# Patient Record
Sex: Male | Born: 1995 | Race: White | Hispanic: No | Marital: Single | Smoking: Current every day smoker
Health system: Southern US, Community
[De-identification: ages and names within clinical notes are randomized; demographics above are authoritative.]

## PROBLEM LIST (undated history)

## (undated) DIAGNOSIS — G8929 Other chronic pain: Secondary | ICD-10-CM

## (undated) DIAGNOSIS — M549 Dorsalgia, unspecified: Secondary | ICD-10-CM

## (undated) HISTORY — PX: HERNIA REPAIR: SHX51

---
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
## 2015-01-12 ENCOUNTER — Encounter (HOSPITAL_COMMUNITY): Payer: Self-pay

## 2015-01-12 ENCOUNTER — Emergency Department (HOSPITAL_COMMUNITY)
Admission: EM | Admit: 2015-01-12 | Discharge: 2015-01-13 | Disposition: A | Discharge status: Home / Self Care | Payer: Medicaid Other | Attending: Emergency Medicine | Admitting: Emergency Medicine

## 2015-01-12 DIAGNOSIS — G8929 Other chronic pain: Secondary | ICD-10-CM | POA: Insufficient documentation

## 2015-01-12 DIAGNOSIS — Z720 Tobacco use: Secondary | ICD-10-CM | POA: Insufficient documentation

## 2015-01-12 DIAGNOSIS — F1410 Cocaine abuse, uncomplicated: Secondary | ICD-10-CM | POA: Diagnosis not present

## 2015-01-12 NOTE — ED Notes (Addendum)
Pt presents with c/o request for detox from cocaine. Pt reports his last use was a few hours ago.Pt reporting that "his heart hurts because he used a lot of cocaine". Pt denies SI/HI, calm and cooperative.

## 2015-01-13 NOTE — ED Provider Notes (Signed)
CSN: [id number]     Arrival date & time 01/12/15  2122 History   First MD Initiated Contact with Patient 01/13/15 0121     Chief Complaint  Patient presents with  . Detox      (Consider location/radiation/quality/duration/timing/severity/associated sxs/prior Treatment) HPI Comments: Patient brought in by family for cocaine abuse.  Patient states he does not need help with his cocaine abuse.  He does not feel he is addicted, is not suicidal,  he is not homicidal  The history is provided by the patient.    Past Medical History  Diagnosis Date  . Chronic back pain    Past Surgical History  Procedure Laterality Date  . Hernia repair     No family history on file. History  Substance Use Topics  . Smoking status: Current Every Day Smoker    Types: Cigarettes  . Smokeless tobacco: Never Used  . Alcohol Use: Yes     Comment: occasionally    Review of Systems  Constitutional: Negative for fever.  Gastrointestinal: Negative for nausea.  Skin: Negative for rash.  Neurological: Negative for dizziness.  Psychiatric/Behavioral: Negative for suicidal ideas.  All other systems reviewed and are negative.     Allergies  Review of patient's allergies indicates no known allergies.  Home Medications   Prior to Admission medications   Medication Sig Start Date End Date Taking? Authorizing Provider  albuterol (PROVENTIL HFA;VENTOLIN HFA) 108 (90 BASE) MCG/ACT inhaler Inhale 4 puffs into the lungs every 4 (four) hours as needed for wheezing or shortness of breath (cough). 12/14/14   Loleta Roseory Forbach, MD  benzonatate (TESSALON PERLES) 100 MG capsule Take 1 capsule (100 mg total) by mouth every 6 (six) hours as needed for cough. 12/14/14 12/14/15  Loleta Roseory Forbach, MD   BP 125/72 mmHg  Pulse 76  Temp(Src) 97.5 F (36.4 C) (Oral)  Resp 18  SpO2 100% Physical Exam  Constitutional: He is oriented to person, place, and time. He appears well-nourished.  HENT:  Head: Normocephalic.  Neurological: He  is alert and oriented to person, place, and time.  Psychiatric: He has a normal mood and affect. His speech is normal and behavior is normal. Cognition and memory are normal. He expresses inappropriate judgment. He expresses no suicidal ideation. He expresses no suicidal plans.  Nursing note and vitals reviewed.   ED Course  Procedures (including critical care time) Labs Review Labs Reviewed - No data to display  Imaging Review No results found.   EKG Interpretation None     Patient states he does not need detox from cocaine.  He does not want to stop.  He denies being suicidal and homicidal again.  He has been given outpatient resources MDM   Final diagnoses:  Cocaine abuse         Earley FavorGail Kirsi Hugh, NP 01/13/15 0132  Paula LibraJohn Molpus, MD 01/13/15 [phone]

## 2015-01-13 NOTE — Discharge Instructions (Signed)
Stimulant Use Disorder-Cocaine  Cocaine is one of a group of powerful drugs called stimulants. Cocaine has medical uses for stopping nosebleeds and for pain control before minor nose or dental surgery. However, cocaine is misused because of the effects that it produces. These effects include:   · A feeling of extreme pleasure.  · Alertness.  · High energy.  Common street names for cocaine include coke, crack, blow, snow, and nose candy. Cocaine is snorted, dissolved in water and injected, or smoked.   Stimulants are addictive because they activate regions of the brain that produce both the pleasurable sensation of "reward" and psychological dependence. Together, these actions account for loss of control and the rapid development of drug dependence. This means you become ill without the drug (withdrawal) and need to keep using it to function.   Stimulant use disorder is use of stimulants that disrupts your daily life. It disrupts relationships with family and friends and how you do your job. Cocaine increases your blood pressure and heart rate. It can cause a heart attack or stroke. Cocaine can also cause death from irregular heart rate or seizures.  SYMPTOMS  Symptoms of stimulant use disorder with cocaine include:  · Use of cocaine in larger amounts or over a longer period of time than intended.  · Unsuccessful attempts to cut down or control cocaine use.  · A lot of time spent obtaining, using, or recovering from the effects of cocaine.  · A strong desire or urge to use cocaine (craving).  · Continued use of cocaine in spite of major problems at work, school, or home because of use.  · Continued use of cocaine in spite of relationship problems because of use.  · Giving up or cutting down on important life activities because of cocaine use.  · Use of cocaine over and over in situations when it is physically hazardous, such as driving a car.  · Continued use of cocaine in spite of a physical problem that is likely  related to use. Physical problems can include:  ¨ Malnutrition.  ¨ Nosebleeds.  ¨ Chest pain.  ¨ High blood pressure.  ¨ A hole that develops between the part of your nose that separates your nostrils (perforated nasal septum).  ¨ Lung and kidney damage.  · Continued use of cocaine in spite of a mental problem that is likely related to use. Mental problems can include:  ¨ Schizophrenia-like symptoms.  ¨ Depression.  ¨ Bipolar mood swings.  ¨ Anxiety.  ¨ Sleep problems.  · Need to use more and more cocaine to get the same effect, or lessened effect over time with use of the same amount of cocaine (tolerance).  · Having withdrawal symptoms when cocaine use is stopped, or using cocaine to reduce or avoid withdrawal symptoms. Withdrawal symptoms include:  ¨ Depressed or irritable mood.  ¨ Low energy or restlessness.  ¨ Bad dreams.  ¨ Poor or excessive sleep.  ¨ Increased appetite.  DIAGNOSIS  Stimulant use disorder is diagnosed by your health care provider. You may be asked questions about your cocaine use and how it affects your life. A physical exam may be done. A drug screen may be ordered. You may be referred to a mental health professional. The diagnosis of stimulant use disorder requires at least two symptoms within 12 months. The type of stimulant use disorder depends on the number of signs and symptoms you have. The type may be:  · Mild. Two or three signs and symptoms.  ·   Counseling or talk therapy. Talk therapy addresses the reasons you use cocaine and ways to keep you from using again. Goals of talk therapy include:  Identifying and avoiding triggers for use.  Handling cravings.  Replacing use with healthy activities.  Support groups.  Support groups provide emotional support, advice, and guidance.  Medicine. Certain medicines may decrease cocaine cravings or withdrawal symptoms. HOME CARE INSTRUCTIONS  Take medicines only as directed by your health care provider.  Identify the people and activities that trigger your cocaine use and avoid them.  Keep all follow-up visits as directed by your health care provider. SEEK MEDICAL CARE IF:  Your symptoms get worse or you relapse.  You are not able to take medicines as directed. SEEK IMMEDIATE MEDICAL CARE IF:  You have serious thoughts about hurting yourself or others.  You have a seizure, chest pain, sudden weakness, or loss of speech or vision. FOR MORE INFORMATION  National Institute on Drug Abuse: http://www.price-smith.com/  Substance Abuse and Mental Health Services Administration: SkateOasis.com.pt Document Released: 07/22/2000 Document Revised: 12/09/2013 Document Reviewed: 08/07/2013 [facility] Patient Information 2015 [place], [place]. This information is not intended to replace advice given to you by your health care provider. Make sure you discuss any questions you have with your health care provider.  Emergency Department Resource Guide 1) Find a Doctor and Pay Out of Pocket Although you won't have to find out who is covered by your insurance plan, it is a good idea to ask around and get recommendations. You will then need to call the office and see if the doctor you have chosen will accept you as a new patient and what types of options they offer for patients who are self-pay. Some doctors offer discounts or will set up payment plans for their patients who do not have insurance, but you will need to ask so you aren't surprised when you get to your appointment.  2) Contact Your Local Health Department Not all health departments have doctors that can see patients for sick visits, but many do, so it is worth a call to see if yours does. If you don't know where your local  health department is, you can check in your phone book. The CDC also has a tool to help you locate your state's health department, and many state websites also have listings of all of their local health departments.  3) Find a Walk-in Clinic If your illness is not likely to be very severe or complicated, you may want to try a walk in clinic. These are popping up all over the country in pharmacies, drugstores, and shopping centers. They're usually staffed by nurse practitioners or physician assistants that have been trained to treat common illnesses and complaints. They're usually fairly quick and inexpensive. However, if you have serious medical issues or chronic medical problems, these are probably not your best option.  No Primary Care Doctor: - Call Health Connect at  [phone] - they can help you locate a primary care doctor that  accepts your insurance, provides certain services, etc. - Physician Referral Service- [phone]  Chronic Pain Problems: Organization         Address  Phone   Notes  [facility]  [phone] Patients need to be referred by their primary care doctor.   Medication Assistance: Organization         Address  Phone   Notes  [facility] Medication Assistance Program [address] [place] [place], [place] [address] [phone]  [phone] --Must be a resident of [place] -- Must have NO insurance coverage whatsoever (no Medicaid/ Medicare, etc.) -- The pt. MUST have a primary care doctor that directs their care regularly and follows them in the community   Med[facility]ist  [phone]   [employer]  [phone]) [phone]    Agencies that provide inexpensive medical care: Organization         Address  Phone   Notes  [facility]  [phone]) [phone]   [facility]    [phone]   [facility] [address] [place], [place] [address] [phone]   [facility] [address], [place] [phone]) [phone]   [facility]    [phone]) [phone]   [facility]    [phone]) [phone]   [facility]  [address], [place] Phone:  [phone], Fax:  [phone] Hours of Operation:  9 am - 6 pm, M-F.  Also accepts Medicaid/Medicare and self-pay.  [facility]  [address], [place] Phone: [phone], Fax: [phone]. Hours of Operation:  8:30 am - 5:30 pm, M-F.  Also accepts Medicaid and self-pay.  [facility] [place] [address], [place] Phone: [phone]   [facility] [address], [place], [place] [phone], Ext. [phone] Mondays & Thursdays: 7-9 AM.  First 15 patients are seen on a first come, first serve basis.    Medicaid-accepting [facility] [place] Providers:  Organization         Address  Phone   Notes  [facility] [address], [place] [phone] Also accepts self-pay patients.  [facility] [address], [place]  [phone]) [phone]   [facility] [address], [place] [phone]   [facility] [address] [place] [address], [place] [phone]   Renaye RakersVeita Bland [address], [place]   [phone] Only accepts [place] Access [place] patients after they have their name applied to their card.   Self-Pay (no insurance) in [place] [facility] [place]:  Organization         Address  Phone   Notes  Sickle Cell Patients, [facility] [address], [place] [phone]   [facility] [address], [place] [phone]   [facility] [place]  [address], [place] [phone]   [facility]/Dr. Osei-Bonsu  [address] [place] [address], [place] or [phone], [place] [phone] Phone number for both [place] and  [place] locations is the same.  [facility] [address], [place] [phone]   [facility] [place] [address] [place] [address], [place] or [address] [phone]   [facility] [address], [place] [phone], phone; [phone]) [phone], fax Sees patients 1st and 3rd Saturday of every month.  Must not qualify for public or private insurance (i.e. Medicaid, Medicare, [place] Health Choice, Veterans' Benefits)  Household income should be no more than 200% of the poverty level The clinic cannot treat you if you are pregnant or think you are pregnant  Sexually transmitted diseases are not treated at the clinic.    Dental Care: Organization  Address  Phone  Notes  [facility] [address] [place], [place] [phone] Accepts children up to age 67 who are enrolled in [place] or [place] Health Choice; pregnant women with a Medicaid card; and children who have applied for Medicaid or [place] Health Choice, but were declined, whose parents can pay a reduced fee at time of service.  [facility] Department of [facility]  [address], [place] [phone] Accepts children up to age 3 who are enrolled in [place] or [place] Health Choice; pregnant women with a Medicaid card; and children who have applied for Medicaid or  Health Choice, but were declined, whose parents can pay a reduced fee at time of service.  [facility]  [address] [place], [place] [phone] Patients are seen by appointment only. Walk-ins are not accepted. [facility] will see patients 45 years of age and older. Monday - Tuesday (8am-5pm) Most Wednesdays (8:30-5pm) $30 per visit, cash only  [facility] Adult Dental Access PROGRAM  [address], [facility] [phone] Patients are seen by appointment only. Walk-ins are not accepted.  [facility] will see patients 45 years of age and older. One Wednesday Evening (Monthly: Volunteer Based).  $30 per visit, cash only  [employer]  [phone] for adults; Children under age 35, call Graduate Pediatric Dentistry at [phone]. Children aged 45-14, please call [phone] to request a pediatric application.  Dental services are provided in all areas of dental care including fillings, crowns and bridges, complete and partial dentures, implants, gum treatment, root canals, and extractions. Preventive care is also provided. Treatment is provided to both adults and children. Patients are selected via a lottery and there is often a waiting list.   [facility] [address] [place]  [phone] [email or web address]   [facility] [address] [place], [place] [phone], Ext. [phone] Second and Fourth Thursday of each month, opens at 6:30 AM; Clinic ends at 9 AM.  Patients are seen on a first-come first-served basis, and a limited number are seen during each clinic.   [facility]  [address] [place], [place] [phone]   Eligibility Requirements You must have lived in [place], [place], or [place] counties for at least the last three months.   You cannot be eligible for state or federal sponsored [place], including [employer], [place], or [employer].   You generally cannot be eligible for healthcare insurance through your employer.    How to apply: Eligibility screenings are held every Tuesday and Wednesday afternoon from 1:00 pm until 4:00 pm. You do not need an appointment for the interview!  [facility] [address] [place], [place] [phone]   [facility]  [phone]   [facility] Health Department  [phone]   [facility] Health Department  [phone]    Behavioral Health Resources in the  Community: Intensive Outpatient Programs Organization         Address  Phone  Notes  [facility] [address], [place], [place] [phone]   [facility] [address] [place], [place] [phone]   [facility] [address] [place], [place]  [phone]   [facility] [address]  [place], [place] [phone] or [phone]   Substance Abuse Resources  Organization         Address  Phone  Notes  Alcohol [facility] Drug Services  [phone]   [facility]  [phone]   [facility] [place]  [phone]   Floydene Flock  ([phone]   Residential & Outpatient Substance Abuse Program  [phone]   Psychological Services Organization         Address  Phone  Notes  [facility]  [phone]   [facility] Services  [phone]   [facility] [address] [place] [phone] or [phone]    Mobile Crisis Teams Organization         Address  Phone  Notes  [facility], Mobile Crisis Care Unit  [phone]   [facility]  [address] [place], [place] [phone]   Doristine Locks [address] [place] [phone]    Self-Help/Support Groups Organization         Address  Phone             Notes  [facility] [place] - variety of support groups  [phone]- I7437963 Call for more information  Narcotics Anonymous (NA), [facility] [address], [employer] [place]  2 meetings at this location   Statistician         Address  Phone  Notes  [facility] [address],    [place]  [phone]   [facility]  [address], [place] [address], [place], [place] [phone]   [facility] [address] [place], [place] [phone] Admissions: 8am-3pm M-F  [facility] [address]    [place], [place] [phone]   [facility] [address] [place], [place], [place] [phone]   [facility] [address]  [place], [place] [phone]   [facility] - Intensive Outpatient [address], [place], [place] [phone]   [facility] ([facility] [address] [place]  [place], [place] [phone] or [phone]   [facility] ([facility]) [address], [place], [place] [phone] Accepts Medicaid  [facility] [address],  [place] [phone] Substance Abuse/Addiction Treatment   [facility] Organization         Address  Phone  Notes  [facility]  [phone]   Angie Fava, PhD [address] Kn[address] [place], [place]   [phone] or [phone]   [facility]al   [address] [place], [place] [phone]   [facility] [address] [place], [place] [phone] Insurance/Medicaid/sponsorship through [facility] and Families [address]                                    [place], [place] [phone] Therapy/tele-psych/case  [facility] [address], [place] [phone]    Dr. Lolly Mustache  [phone]   [facility]  [employer] [facility] Dept. 1) [address], [place] 2) [address], [place] 3)  [address], [place] [phone]  [phone]   [facility] Child Abuse Hotline 929-461-1480 or 6291322450 (After Hours)

## 2015-01-13 NOTE — ED Notes (Signed)
Pt refused vitals at time of discharge. Pt reports "I'm just ready to go".

## 2015-08-04 ENCOUNTER — Encounter: Payer: Self-pay | Admitting: Emergency Medicine

## 2015-08-04 ENCOUNTER — Emergency Department
Admission: EM | Admit: 2015-08-04 | Discharge: 2015-08-04 | Disposition: A | Discharge status: Other Acute Inpatient Hospital | Payer: Medicaid Other | Attending: Emergency Medicine | Admitting: Emergency Medicine

## 2015-08-04 DIAGNOSIS — F1410 Cocaine abuse, uncomplicated: Secondary | ICD-10-CM

## 2015-08-04 DIAGNOSIS — F1994 Other psychoactive substance use, unspecified with psychoactive substance-induced mood disorder: Secondary | ICD-10-CM

## 2015-08-04 DIAGNOSIS — F4320 Adjustment disorder, unspecified: Secondary | ICD-10-CM | POA: Insufficient documentation

## 2015-08-04 DIAGNOSIS — F17210 Nicotine dependence, cigarettes, uncomplicated: Secondary | ICD-10-CM | POA: Insufficient documentation

## 2015-08-04 DIAGNOSIS — F10120 Alcohol abuse with intoxication, uncomplicated: Secondary | ICD-10-CM | POA: Insufficient documentation

## 2015-08-04 DIAGNOSIS — R45851 Suicidal ideations: Secondary | ICD-10-CM

## 2015-08-04 DIAGNOSIS — Z8659 Personal history of other mental and behavioral disorders: Secondary | ICD-10-CM

## 2015-08-04 DIAGNOSIS — F10920 Alcohol use, unspecified with intoxication, uncomplicated: Secondary | ICD-10-CM

## 2015-08-04 DIAGNOSIS — F1010 Alcohol abuse, uncomplicated: Secondary | ICD-10-CM

## 2015-08-04 LAB — COMPREHENSIVE METABOLIC PANEL
ALK PHOS: 70 U/L (ref 38–126)
ALT: 28 U/L (ref 17–63)
AST: 25 U/L (ref 15–41)
Albumin: 4.9 g/dL (ref 3.5–5.0)
Anion gap: 11 (ref 5–15)
BILIRUBIN TOTAL: 0.8 mg/dL (ref 0.3–1.2)
BUN: 10 mg/dL (ref 6–20)
CALCIUM: 9.4 mg/dL (ref 8.9–10.3)
CHLORIDE: 108 mmol/L (ref 101–111)
CO2: 24 mmol/L (ref 22–32)
Creatinine, Ser: 1.01 mg/dL (ref 0.61–1.24)
GFR calc non Af Amer: >60 mL/min (ref >60)
Glucose, Bld: 97 mg/dL (ref 65–99)
Potassium: 4 mmol/L (ref 3.5–5.1)
Sodium: 143 mmol/L (ref 135–145)
TOTAL PROTEIN: 8.5 g/dL — AB (ref 6.5–8.1)

## 2015-08-04 LAB — CBC
HCT: 47.9 % (ref 40.0–52.0)
Hemoglobin: 15.7 g/dL (ref 13.0–18.0)
MCH: 30.1 pg (ref 26.0–34.0)
MCHC: 32.9 g/dL (ref 32.0–36.0)
MCV: 91.6 fL (ref 80.0–100.0)
PLATELETS: 322 10*3/uL (ref 150–440)
RBC: 5.23 MIL/uL (ref 4.40–5.90)
RDW: 13.5 % (ref 11.5–14.5)
WBC: 11.5 10*3/uL — ABNORMAL HIGH (ref 3.8–10.6)

## 2015-08-04 LAB — URINE DRUG SCREEN, QUALITATIVE (ARMC ONLY)
Amphetamines, Ur Screen: NOT DETECTED
BARBITURATES, UR SCREEN: NOT DETECTED
Benzodiazepine, Ur Scrn: NOT DETECTED
CANNABINOID 50 NG, UR ~~LOC~~: NOT DETECTED
Cocaine Metabolite,Ur ~~LOC~~: POSITIVE — AB
MDMA (Ecstasy)Ur Screen: NOT DETECTED
Methadone Scn, Ur: NOT DETECTED
Opiate, Ur Screen: NOT DETECTED
PHENCYCLIDINE (PCP) UR S: NOT DETECTED
TRICYCLIC, UR SCREEN: NOT DETECTED

## 2015-08-04 LAB — ETHANOL: ALCOHOL ETHYL (B): 129 mg/dL — AB (ref <5)

## 2015-08-04 LAB — SALICYLATE LEVEL

## 2015-08-04 LAB — ACETAMINOPHEN LEVEL: Acetaminophen (Tylenol), Serum: <10 ug/mL — ABNORMAL LOW (ref 10–30)

## 2015-08-04 NOTE — ED Notes (Signed)
TTS at bedside for first intact.

## 2015-08-04 NOTE — ED Notes (Signed)
BEHAVIORAL HEALTH ROUNDING Patient sleeping: No. Patient alert and oriented: yes Behavior appropriate: Yes.  ; If no, describe:  Nutrition and fluids offered: yes Toileting and hygiene offered: Yes  Sitter present: q15 minute observations and security  monitoring Law enforcement present: Yes  ODS  

## 2015-08-04 NOTE — Discharge Instructions (Signed)
Alcohol Use Disorder °Alcohol use disorder is a mental disorder. It is not a one-time incident of heavy drinking. Alcohol use disorder is the excessive and uncontrollable use of alcohol over time that leads to problems with functioning in one or more areas of daily living. People with this disorder risk harming themselves and others when they drink to excess. Alcohol use disorder also can cause other mental disorders, such as mood and anxiety disorders, and serious physical problems. People with alcohol use disorder often misuse other drugs.  °Alcohol use disorder is common and widespread. Some people with this disorder drink alcohol to cope with or escape from negative life events. Others drink to relieve chronic pain or symptoms of mental illness. People with a family history of alcohol use disorder are at higher risk of losing control and using alcohol to excess.  °Drinking too much alcohol can cause injury, accidents, and health problems. One drink can be too much when you are: °· Working. °· Pregnant or breastfeeding. °· Taking medicines. Ask your doctor. °· Driving or planning to drive. °SYMPTOMS  °Signs and symptoms of alcohol use disorder may include the following:  °· Consumption of alcohol in larger amounts or over a longer period of time than intended. °· Multiple unsuccessful attempts to cut down or control alcohol use.   °· A great deal of time spent obtaining alcohol, using alcohol, or recovering from the effects of alcohol (hangover). °· A strong desire or urge to use alcohol (cravings).   °· Continued use of alcohol despite problems at work, school, or home because of alcohol use.   °· Continued use of alcohol despite problems in relationships because of alcohol use. °· Continued use of alcohol in situations when it is physically hazardous, such as driving a car. °· Continued use of alcohol despite awareness of a physical or psychological problem that is likely related to alcohol use. Physical  problems related to alcohol use can involve the brain, heart, liver, stomach, and intestines. Psychological problems related to alcohol use include intoxication, depression, anxiety, psychosis, delirium, and dementia.   °· The need for increased amounts of alcohol to achieve the same desired effect, or a decreased effect from the consumption of the same amount of alcohol (tolerance). °· Withdrawal symptoms upon reducing or stopping alcohol use, or alcohol use to reduce or avoid withdrawal symptoms. Withdrawal symptoms include: °· Racing heart. °· Hand tremor. °· Difficulty sleeping. °· Nausea. °· Vomiting. °· Hallucinations. °· Restlessness. °· Seizures. °DIAGNOSIS °Alcohol use disorder is diagnosed through an assessment by your health care provider. Your health care provider may start by asking three or four questions to screen for excessive or problematic alcohol use. To confirm a diagnosis of alcohol use disorder, at least two symptoms must be present within a 12-month period. The severity of alcohol use disorder depends on the number of symptoms: °· Mild--two or three. °· Moderate--four or five. °· Severe--six or more. °Your health care provider may perform a physical exam or use results from lab tests to see if you have physical problems resulting from alcohol use. Your health care provider may refer you to a mental health professional for evaluation. °TREATMENT  °Some people with alcohol use disorder are able to reduce their alcohol use to low-risk levels. Some people with alcohol use disorder need to quit drinking alcohol. When necessary, mental health professionals with specialized training in substance use treatment can help. Your health care provider can help you decide how severe your alcohol use disorder is and what type of treatment you need.   necessary, mental health professionals with specialized training in substance use treatment can help. Your health care provider can help you decide how severe your alcohol use disorder is and what type of treatment you need. The following forms of treatment are available:   · Detoxification. Detoxification involves the use of prescription medicines to prevent alcohol withdrawal  symptoms in the first week after quitting. This is important for people with a history of symptoms of withdrawal and for heavy drinkers who are likely to have withdrawal symptoms. Alcohol withdrawal can be dangerous and, in severe cases, cause death. Detoxification is usually provided in a hospital or in-patient substance use treatment facility.  · Counseling or talk therapy. Talk therapy is provided by substance use treatment counselors. It addresses the reasons people use alcohol and ways to keep them from drinking again. The goals of talk therapy are to help people with alcohol use disorder find healthy activities and ways to cope with life stress, to identify and avoid triggers for alcohol use, and to handle cravings, which can cause relapse.  · Medicines. Different medicines can help treat alcohol use disorder through the following actions:    Decrease alcohol cravings.    Decrease the positive reward response felt from alcohol use.    Produce an uncomfortable physical reaction when alcohol is used (aversion therapy).  · Support groups. Support groups are run by people who have quit drinking. They provide emotional support, advice, and guidance.  These forms of treatment are often combined. Some people with alcohol use disorder benefit from intensive combination treatment provided by specialized substance use treatment centers. Both inpatient and outpatient treatment programs are available.     This information is not intended to replace advice given to you by your health care provider. Make sure you discuss any questions you have with your health care provider.     Document Released: 09/01/2004 Document Revised: 08/15/2014 Document Reviewed: 11/01/2012  Elsevier Interactive Patient Education ©2016 Elsevier Inc.      Chemical Dependency  Chemical dependency is an addiction to drugs or alcohol. It is characterized by the repeated behavior of seeking out and using drugs and alcohol despite harmful consequences to  the health and safety of ones self and others.   RISK FACTORS  There are certain situations or behaviors that increase a person's risk for chemical dependency. These include:  · A family history of chemical dependency.  · A history of mental health issues, including depression and anxiety.  · A home environment where drugs and alcohol are easily available to you.  · Drug or alcohol use at a young age.  SYMPTOMS   The following symptoms can indicate chemical dependency:  · Inability to limit the use of drugs or alcohol.  · Nausea, sweating, shakiness, and anxiety that occurs when alcohol or drugs are not being used.  · An increase in amount of drugs or alcohol that is necessary to get drunk or high.  People who experience these symptoms can assess their use of drugs and alcohol by asking themselves the following questions:  · Have you been told by friends or family that they are worried about your use of alcohol or drugs?  · Do friends and family ever tell you about things you did while drinking alcohol or using drugs that you do not remember?  · Do you lie about using alcohol or drugs or about the amounts you use?  · Do you have difficulty completing daily tasks unless you use alcohol or drugs?  ·   is suggested.   This information is not intended to replace advice given to you by your health care provider. Make sure you discuss any questions you have with your health care provider.   Document Released: 07/19/2001 Document Revised: 10/17/2011 Document Reviewed: 09/30/2010 Elsevier Interactive Patient Education [id number]  Elsevier Inc.

## 2015-08-04 NOTE — ED Notes (Signed)
Patient observed lying in bed with eyes closed  Even, unlabored respirations observed   NAD pt appears to be sleeping  I will continue to monitor along with every 15 minute visual observations and ongoing security camera monitoring    

## 2015-08-04 NOTE — BH Assessment (Signed)
Assessment Note  Adam Velez is an 19 y.o. male. [name].  He reports that he contacted his mother to come and get him from his girl-friend's house and she refused so I said "F" it and started to walk home.  She is making a big deal out of nothing. He reports that she does this all the time, He states that this is like his forth IVC by his mother. He denied symptoms of depression or anxiety.  He denied auditory or visual hallucinations. He denied suicidal or homicidal ideation or intent. He denied a history of behavioral health problems or concerns.  He reports that he used alcohol. He was unsure as to the amount. He reports using cocaine, he was unsure as to how much. He states that his girl-friend refused to take him home, they were arguing, and he wanted to go.  IVC documentation reports that he has a DWI pending [place] 09[place]01/2015 in [place] and [place]  IVC state that [name] told his mother that he is worried about his court dates. That he didn't care anymore and that he didn't care if he was here. Respondent is reported as saying he should be dead. He is further reported as telling his mother that he "wants 224 hours to hisself".  [name] reports "I don't know why she does this, she takes out papers on me for no reason, she keeps doing this."  Diagnosis: Past Medical History:  Past Medical History  Diagnosis Date  . Chronic back pain     Past Surgical History  Procedure Laterality Date  . Hernia repair      Family History: No family history on file.  Social History:  reports that he has been smoking Cigarettes.  He has been smoking about 0.50 packs per day. He has never used smokeless tobacco. He reports that he drinks alcohol. He reports that he does not use illicit drugs.  Additional Social History:  Alcohol / Drug Use History of alcohol / drug use?: Yes Longest period of sobriety  (when/how long): 9 months, relapsed 3 months ago. He states that he attended NA meetings to maintain Negative Consequences of Use: Legal, Personal relationships Substance #1 Name of Substance 1: alcohol 1 - Age of First Use: 11 1 - Amount (size/oz): Unsure/Varied 1 - Frequency: Varied 1 - Last Use / Amount: 08/03/2015 Substance #2 Name of Substance 2: Cocaine 2 - Age of First Use: Unsure 2 - Amount (size/oz): Unsure 2 - Frequency: rarely 2 - Last Use / Amount: 08/03/2015  CIWA: CIWA-Ar BP: (!) 140/91 mmHg Pulse Rate: (!) 108 COWS:    Allergies: No Known Allergies  Home Medications:  (Not in a hospital admission)  OB/GYN Status:  No LMP for male patient.  General Assessment Data [facility] ED TTS Assessment: In system Is this a Tele or Face-to-Face Assessment?: Face-to-Face Is this an Initial Assessment or a Re-assessment for this encounter?: Initial Assessment Marital status: Single Maiden name: n/a Is patient pregnant?: No Pregnancy Status: No Living Arrangements: Parent Can pt return to current living arrangement?: Yes Admission Status: Involuntary Is patient capable of signing voluntary admission?: Yes Referral Source: Self/Family/Friend Insurance type: None  Medical Screening Exam [facility] Walk-in ONLY) Medical Exam completed: Yes  Crisis Care Plan Living Arrangements: Parent Legal Guardian: Other: (None) Name of Psychiatrist: Denied Name of Therapist: Denied  Education Status Is patient currently  in school?: No Current Grade: n/a Highest grade of school patient has completed: 11th Name of school: [employer] [place] [employer] Solicitor person: n/a  Risk to self with the past 6 months Suicidal Ideation: No Has patient been a risk to self within the past 6 months prior to admission? : No Suicidal Intent: No Has patient had any suicidal intent within the past 6 months prior to admission? : No Is patient at risk for suicide?: No Suicidal Plan?:  No Has patient had any suicidal plan within the past 6 months prior to admission? : No Access to Means: No What has been your use of drugs/alcohol within the last 12 months?: use of alcohol and cocaine Previous Attempts/Gestures: No How many times?: 0 Other Self Harm Risks: denied Triggers for Past Attempts: Other (Comment) Intentional Self Injurious Behavior: None Family Suicide History: No Recent stressful life event(s): Conflict (Comment), Legal Issues (arguing with girlfriend, court date) Persecutory voices/beliefs?: No Depression: No Depression Symptoms:  (Denied) Substance abuse history and/or treatment for substance abuse?: Yes Suicide prevention information given to non-admitted patients: Not applicable  Risk to Others within the past 6 months Homicidal Ideation: No Does patient have any lifetime risk of violence toward others beyond the six months prior to admission? : No Thoughts of Harm to Others: No Current Homicidal Intent: No Current Homicidal Plan: No Access to Homicidal Means: No Identified Victim: None reported History of harm to others?: No Assessment of Violence: None Noted Violent Behavior Description: denied Does patient have access to weapons?: No Criminal Charges Pending?: Yes Describe Pending Criminal Charges: DUI Does patient have a court date: Yes Court Date: 09/01/15 (09/15/2015) Is patient on probation?: No  Psychosis Hallucinations: None noted Delusions: None noted  Mental Status Report Appearance/Hygiene: In scrubs Eye Contact: Poor Motor Activity: Unremarkable Speech: Logical/coherent Level of Consciousness: Alert Mood: Irritable Affect: Irritable Anxiety Level: None Thought Processes: Coherent Judgement: Unimpaired Orientation: Person, Place, Situation Obsessive Compulsive Thoughts/Behaviors: None  Cognitive Functioning Concentration: Normal Memory: Recent Intact IQ: Average Insight: Fair Impulse Control: Fair Appetite:  Fair Sleep: No Change Vegetative Symptoms: None  ADLScreening [facility] Assessment Services) Patient's cognitive ability adequate to safely complete daily activities?: Yes Patient able to express need for assistance with ADLs?: Yes Independently performs ADLs?: Yes (appropriate for developmental age)  Prior Inpatient Therapy Prior Inpatient Therapy: Yes Prior Therapy Dates: 2014 Prior Therapy Facilty/Provider(s): [facility] [place] [place] Reason for Treatment: Substance abuse  Prior Outpatient Therapy Prior Outpatient Therapy: No Prior Therapy Dates: n/a Prior Therapy Facilty/Provider(s): n/a Reason for Treatment: n/a Does patient have an ACCT team?: No Does patient have Intensive In-House Services?  : No Does patient have [facility] services? : No Does patient have P4CC services?: No  ADL Screening (condition at time of admission) Patient's cognitive ability adequate to safely complete daily activities?: Yes Patient able to express need for assistance with ADLs?: Yes Independently performs ADLs?: Yes (appropriate for developmental age)       Abuse/Neglect Assessment (Assessment to be complete while patient is alone) Physical Abuse: Denies Verbal Abuse: Denies Sexual Abuse: Denies Exploitation of patient/patient's resources: Denies Self-Neglect: Denies Values / Beliefs Cultural Requests During Hospitalization: None Spiritual Requests During Hospitalization: None   Advance Directives (For Healthcare) Does patient have an advance directive?: No Would patient like information on creating an advanced directive?: Yes - Educational materials given    Additional Information 1:1 In Past 12 Months?: No CIRT Risk: No Elopement Risk: No Does patient have medical clearance?: Yes  Disposition:  Disposition Initial Assessment Completed for this Encounter: Yes Disposition of Patient: Other dispositions  On Site Evaluation by:   Reviewed with Physician:    Justice Deeds 08/04/2015 4:42 AM

## 2015-08-04 NOTE — ED Notes (Signed)
Pt's mother contact: Isac SarnaMarcia Gordon at [phone]

## 2015-08-04 NOTE — ED Notes (Signed)
BEHAVIORAL HEALTH ROUNDING Patient sleeping: Yes.   Patient alert and oriented: eyes closed  Appears asleep Behavior appropriate: Yes.  ; If no, describe:  Nutrition and fluids offered: Yes  Toileting and hygiene offered: sleeping Sitter present: q 15 minute observations and security monitoring Law enforcement present: yes  ODS 

## 2015-08-04 NOTE — ED Notes (Signed)
[facility] has consulted and pt reports that he will be discharging to home

## 2015-08-04 NOTE — ED Notes (Signed)
Patient ambulatory to triage with steady gait, without difficulty or distress noted, [place] [employer]; papers indicate pt with pending DWI and made comments he should be dead; pt denies SI or HI
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
## 2015-08-04 NOTE — ED Notes (Signed)
ED BHU PLACEMENT JUSTIFICATION Is the patient under IVC or is there intent for IVC: Yes.   Is the patient medically cleared: Yes.   Is there vacancy in the ED BHU: Yes.   Is the population mix appropriate for patient: Yes.   Is the patient awaiting placement in inpatient or outpatient setting:   Has the patient had a psychiatric consult:  Consult pending    Survey of unit performed for contraband, proper placement and condition of furniture, tampering with fixtures in bathroom, shower, and each patient room: Yes.  ; Findings:  APPEARANCE/BEHAVIOR Calm and cooperative NEURO ASSESSMENT Orientation: oriented x3  Denies pain Hallucinations: No.None noted (Hallucinations) Speech: Normal Gait: normal RESPIRATORY ASSESSMENT Even  Unlabored respirations  CARDIOVASCULAR ASSESSMENT Pulses equal   regular rate  Skin warm and dry   GASTROINTESTINAL ASSESSMENT no GI complaint EXTREMITIES Full ROM  PLAN OF CARE Provide calm/safe environment. Vital signs assessed twice daily. ED BHU Assessment once each 12-hour shift. Collaborate with intake RN daily or as condition indicates. Assure the ED provider has rounded once each shift. Provide and encourage hygiene. Provide redirection as needed. Assess for escalating behavior; address immediately and inform ED provider.  Assess family dynamic and appropriateness for visitation as needed: Yes.  ; If necessary, describe findings:  Educate the patient/family about BHU procedures/visitation: Yes.  ; If necessary, describe findings:   

## 2015-08-04 NOTE — ED Notes (Addendum)

## 2015-08-04 NOTE — ED Provider Notes (Signed)
[facility] Emergency Department Provider Note  ____________________________________________  Time seen: Approximately 148 AM  I have reviewed the triage vital signs and the nursing notes.   HISTORY  Chief Complaint Mental Health Problem    HPI Adam Velez is a 19 y.o. male who was brought in by police under involuntary commitment. The patient reports that he had been drinking tonight and needed to be picked up by his mother. He reports that when his mother refused to pick him up he told her he would walk. As he was walking home he reports that he was approached by the police and they told him he needed to come in to be evaluated. He reports that his mother called the police to look for him. The patient denies suicidal or homicidal ideation. The patient also denies feeling depressed or anxious. He reports that he had been drinking quite a bit are again with his girlfriend but he simply wanted his mother to pick him up so he could go home and go to sleep.   Past Medical History  Diagnosis Date  . Chronic back pain     There are no active problems to display for this patient.   Past Surgical History  Procedure Laterality Date  . Hernia repair      Current Outpatient Rx  Name  Route  Sig  Dispense  Refill  . albuterol (PROVENTIL HFA;VENTOLIN HFA) 108 (90 BASE) MCG/ACT inhaler   Inhalation   Inhale 4 puffs into the lungs every 4 (four) hours as needed for wheezing or shortness of breath (cough).   1 Inhaler   2   . benzonatate (TESSALON PERLES) 100 MG capsule   Oral   Take 1 capsule (100 mg total) by mouth every 6 (six) hours as needed for cough.   30 capsule   0     Allergies Review of patient's allergies indicates no known allergies.  No family history on file.  Social History Social History  Substance Use Topics  . Smoking status: Current Every Day Smoker -- 0.50 packs/day    Types: Cigarettes  . Smokeless tobacco: Never Used  .  Alcohol Use: Yes     Comment: occasionally    Review of Systems Constitutional: No fever/chills Eyes: No visual changes. ENT: No sore throat. Cardiovascular: Denies chest pain. Respiratory: Denies shortness of breath. Gastrointestinal: No abdominal pain.  No nausea, no vomiting.  No diarrhea.  No constipation. Genitourinary: Negative for dysuria. Musculoskeletal: Negative for back pain. Skin: Negative for rash. Neurological: Negative for headaches, focal weakness or numbness.  10-point ROS otherwise negative.  ____________________________________________   PHYSICAL EXAM:  VITAL SIGNS: ED Triage Vitals  Enc Vitals Group     BP 08/04/15 0043 140/91 mmHg     Pulse Rate 08/04/15 0043 108     Resp 08/04/15 0043 18     Temp 08/04/15 0043 98 F (36.7 C)     Temp Source 08/04/15 0043 Oral     SpO2 08/04/15 0043 95 %     Weight 08/04/15 0043 165 lb (74.844 kg)     Height 08/04/15 0043  (1.803 m)     Head Cir --      Peak Flow --      Pain Score 08/04/15 0133 0     Pain Loc --      Pain Edu? --      Excl. in GC? --     Constitutional: Alert and oriented. Well appearing and in no  acute distress. Eyes: Conjunctivae are normal. PERRL. EOMI. Head: Atraumatic. Nose: No congestion/rhinnorhea. Mouth/Throat: Mucous membranes are moist.  Oropharynx non-erythematous. Cardiovascular: Normal rate, regular rhythm. Grossly normal heart sounds.  Good peripheral circulation. Respiratory: Normal respiratory effort.  No retractions. Lungs CTAB. Gastrointestinal: Soft and nontender. No distention. Positive bowel sounds Musculoskeletal: No lower extremity tenderness nor edema.   Neurologic:  Normal speech and language.  Skin:  Skin is warm, dry and intact.  Psychiatric: Mood and affect are normal.   ____________________________________________   LABS (all labs ordered are listed, but only abnormal results are displayed)  Labs Reviewed  COMPREHENSIVE METABOLIC PANEL - Abnormal;  Notable for the following:    Total Protein 8.5 (*)    All other components within normal limits  ETHANOL - Abnormal; Notable for the following:    Alcohol, Ethyl (B) 129 (*)    All other components within normal limits  ACETAMINOPHEN LEVEL - Abnormal; Notable for the following:    Acetaminophen (Tylenol), Serum <10 (*)    All other components within normal limits  CBC - Abnormal; Notable for the following:    WBC 11.5 (*)    All other components within normal limits  URINE DRUG SCREEN, QUALITATIVE ([facility] ONLY) - Abnormal; Notable for the following:    Cocaine Metabolite,Ur [place] POSITIVE (*)    All other components within normal limits  SALICYLATE LEVEL   ____________________________________________  EKG  none ____________________________________________  RADIOLOGY  none ____________________________________________   PROCEDURES  Procedure(s) performed: None  Critical Care performed: No  ____________________________________________   INITIAL IMPRESSION / ASSESSMENT AND PLAN / ED COURSE  Pertinent labs & imaging results that were available during my care of the patient were reviewed by me and considered in my medical decision making (see chart for details).  This is a 19 year old male who comes into the hospital today being involuntarily committed by his mother. The patient denies suicidal or homicidal ideation but his mother reports he made statements about wanting to die. We will have the patient evaluated by TTS.  I will have the patient evaluated also by psych to determine if the patient is able to eat discharged home or if the patient should need inpatient treatment for his symptoms. ____________________________________________   FINAL CLINICAL IMPRESSION(S) / ED DIAGNOSES  Final diagnoses:  None      Rebecka ApleyAllison P Webster, MD 08/04/15 [phone]

## 2015-08-04 NOTE — ED Notes (Signed)
Lunch provided along with an extra drink  Pt observed with no unusual behavior  Appropriate to stimulation  No verbalized needs or concerns at this time  NAD assessed  Continue to monitor 

## 2015-08-04 NOTE — ED Notes (Signed)
Kennedy Buckerhanh, EDT, to triage for protocols

## 2015-08-04 NOTE — ED Provider Notes (Signed)
Patient is in no acute distress, has been cleared for discharge by Dr. Toni Amendlapacs. He is being referred for outpatient follow-up with [facility]  Emily FilbertJonathan E Efrem Pitstick, MD 08/04/15 1229

## 2015-11-11 ENCOUNTER — Other Ambulatory Visit: Payer: Self-pay | Admitting: Emergency Medicine

## 2017-09-13 ENCOUNTER — Encounter: Payer: Self-pay | Admitting: Emergency Medicine

## 2017-09-13 ENCOUNTER — Other Ambulatory Visit: Payer: Self-pay

## 2017-09-13 ENCOUNTER — Emergency Department
Admission: EM | Admit: 2017-09-13 | Discharge: 2017-09-13 | Disposition: A | Discharge status: Home / Self Care | Payer: Self-pay | Attending: Emergency Medicine | Admitting: Emergency Medicine

## 2017-09-13 DIAGNOSIS — Z79899 Other long term (current) drug therapy: Secondary | ICD-10-CM | POA: Insufficient documentation

## 2017-09-13 DIAGNOSIS — H10021 Other mucopurulent conjunctivitis, right eye: Secondary | ICD-10-CM | POA: Insufficient documentation

## 2017-09-13 DIAGNOSIS — H5789 Other specified disorders of eye and adnexa: Secondary | ICD-10-CM

## 2017-09-13 DIAGNOSIS — F17210 Nicotine dependence, cigarettes, uncomplicated: Secondary | ICD-10-CM | POA: Insufficient documentation

## 2017-09-13 MED ORDER — FLUORESCEIN SODIUM 1 MG OP STRP
1.0000 | ORAL_STRIP | Freq: Once | OPHTHALMIC | Status: AC
  Administered 2017-09-13: 1 via OPHTHALMIC
  Filled 2017-09-13: qty 1

## 2017-09-13 MED ORDER — TETRACAINE HCL 0.5 % OP SOLN
2.0000 [drp] | Freq: Once | OPHTHALMIC | Status: AC
  Administered 2017-09-13: 2 [drp] via OPHTHALMIC
  Filled 2017-09-13: qty 4

## 2017-09-13 MED ORDER — DICLOFENAC SODIUM 0.1 % OP SOLN: 1.0000 [drp] | Freq: Four times a day (QID) | OPHTHALMIC | 0 refills | Status: AC

## 2017-09-13 MED ORDER — ERYTHROMYCIN 5 MG/GM OP OINT: TOPICAL_OINTMENT | Freq: Three times a day (TID) | OPHTHALMIC | 0 refills | Status: AC

## 2017-09-13 NOTE — ED Triage Notes (Signed)
Patient reports possibly getting wood in eye yesterday. States he looked and didn't see anything but he did touch his eye without washing his hands. Woke up this morning to redness and swelling to right eye. Patient reports head congestion x1 week.

## 2017-09-13 NOTE — ED Provider Notes (Signed)
[facility] Emergency Department Provider Note  ____________________________________________  Time seen: Approximately 10:32 AM  I have reviewed the triage vital signs and the nursing notes.   HISTORY  Chief Complaint Eye Problem    HPI Adam Velez is a 22 y.o. male that presents to the emergency department for evaluation of right eye irritation and clear drainage for 1 day.  Patient was outside working yesterday and got a wood chips in his eye.  He tried to rub the wood chips out, which seemed to make symptoms worse.  Eye has been draining clear drainage all night.  Eye is not painful.  His eye feels irritated like something is in his eye but he does not feel like there are any wood chips in his eye currenlty.  He states that it feels like pink eye that he has had in the past.  He does not wear contacts.  No headache, visual changes, floaters, flasher, photophobia, nausea, vomiting.     Past Medical History:  Diagnosis Date  . Chronic back pain     Patient Active Problem List   Diagnosis Date Noted  . Suicidal ideation 08/04/2015  . Substance induced mood disorder (HCC) 08/04/2015  . Alcohol abuse 08/04/2015  . Cocaine abuse (HCC) 08/04/2015  . History of bipolar disorder 08/04/2015    Past Surgical History:  Procedure Laterality Date  . HERNIA REPAIR      Prior to Admission medications   Medication Sig Start Date End Date Taking? Authorizing Provider  albuterol (PROVENTIL HFA;VENTOLIN HFA) 108 (90 BASE) MCG/ACT inhaler Inhale 4 puffs into the lungs every 4 (four) hours as needed for wheezing or shortness of breath (cough). 12/14/14   Loleta RoseForbach, Cory, MD  diclofenac (VOLTAREN) 0.1 % ophthalmic solution Place 1 drop into the right eye 4 (four) times daily. 09/13/17   Enid DerryWagner, Lucette Kratz, PA-C  erythromycin [facility](ROMYCIN) ophthalmic ointment Place into the right eye 3 (three) times daily for 10 days. Place a 1/2 inch ribbon of ointment into the lower eyelid. 09/13/17  09/23/17  Enid DerryWagner, Magdeline Prange, PA-C    Allergies Patient has no known allergies.  No family history on file.  Social History Social History   Tobacco Use  . Smoking status: Current Every Day Smoker    Packs/day: 0.50    Types: Cigarettes  . Smokeless tobacco: Never Used  Substance Use Topics  . Alcohol use: Yes    Comment: occasionally  . Drug use: No     Review of Systems  Constitutional: No fever/chills Cardiovascular: No chest pain. Respiratory: No SOB. Gastrointestinal: No abdominal pain.  No nausea, no vomiting.  Musculoskeletal: Negative for musculoskeletal pain. Skin: Negative for rash, abrasions, lacerations, ecchymosis.   ____________________________________________   PHYSICAL EXAM:  VITAL SIGNS: ED Triage Vitals  Enc Vitals Group     BP 09/13/17 0929 111/88     Pulse Rate 09/13/17 0929 75     Resp 09/13/17 0929 18     Temp 09/13/17 0929 98.2 F (36.8 C)     Temp Source 09/13/17 0929 Oral     SpO2 09/13/17 0929 99 %     Weight 09/13/17 0932 205 lb (93 kg)     Height 09/13/17 0932 5\' 10"  (1.778 m)     Head Circumference --      Peak Flow --      Pain Score 09/13/17 0929 6     Pain Loc --      Pain Edu? --      Excl. in  GC? --      Constitutional: Alert and oriented. Well appearing and in no acute distress. Eyes: PERRL. EOMI. Right eye is injected. Moderate clear drainage from eye. No defect on fluorescence stain. No orbital or eyelid swelling or erythema.  Head: Atraumatic. ENT:      Ears:      Nose: No congestion/rhinnorhea.      Mouth/Throat: Mucous membranes are moist.  Neck: No stridor.  Cardiovascular: Normal rate, regular rhythm.  Good peripheral circulation. Respiratory: Normal respiratory effort without tachypnea or retractions.  Musculoskeletal: Full range of motion to all extremities. No gross deformities appreciated. Neurologic:  Normal speech and language. No gross focal neurologic deficits are appreciated.  Skin:  Skin is warm, dry  and intact. No rash noted. Psychiatric: Mood and affect are normal. Speech and behavior are normal. Patient exhibits appropriate insight and judgement.   ____________________________________________   LABS (all labs ordered are listed, but only abnormal results are displayed)  Labs Reviewed - No data to display ____________________________________________  EKG   ____________________________________________  RADIOLOGY   No results found.  ____________________________________________    PROCEDURES  Procedure(s) performed:    Procedures    Medications  fluorescein ophthalmic strip 1 strip (1 strip Right Eye Given 09/13/17 1107)  tetracaine (PONTOCAINE) 0.5 % ophthalmic solution 2 drop (2 drops Right Eye Given 09/13/17 1107)     ____________________________________________   INITIAL IMPRESSION / ASSESSMENT AND PLAN / ED COURSE  Pertinent labs & imaging results that were available during my care of the patient were reviewed by me and considered in my medical decision making (see chart for details).  Review of the [place] CSRS was performed in accordance of the [facility] prior to dispensing any controlled drugs.   Patient presented to the emergency department for evaluation after eye injury.  Vital signs and exam are reassuring.  Symptoms resolved with tetracaine.  No defect on fluorescein stain.  Patient will be discharged home with prescriptions for ophthalmic diclofenac and erythromycin ointment. Patient is to follow up with ophthalmology as directed. Patient is given ED precautions to return to the ED for any worsening or new symptoms.     ____________________________________________  FINAL CLINICAL IMPRESSION(S) / ED DIAGNOSES  Final diagnoses:  Eye irritation      NEW MEDICATIONS STARTED DURING THIS VISIT:  ED Discharge Orders        Ordered    erythromycin (ROMYCIN) ophthalmic ointment  3 times daily     09/13/17 1057    diclofenac (VOLTAREN) 0.1 %  ophthalmic solution  4 times daily     09/13/17 1057          This chart was dictated using voice recognition software/Dragon. Despite best efforts to proofread, errors can occur which can change the meaning. Any change was purely unintentional.     Enid Derry, PA-C 09/13/17 1110    Schaevitz, Myra Rude, MD 09/13/17 1310
